# Patient Record
Sex: Female | Born: 2000 | Race: White | Hispanic: No | Marital: Single | Smoking: Current every day smoker
Health system: Southern US, Community
[De-identification: ages and names within clinical notes are randomized; demographics above are authoritative.]

---
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
## 2020-08-28 ENCOUNTER — Ambulatory Visit (INDEPENDENT_AMBULATORY_CARE_PROVIDER_SITE_OTHER): Payer: Medicaid Other | Admitting: Family Medicine

## 2020-08-28 ENCOUNTER — Other Ambulatory Visit: Payer: Self-pay

## 2020-08-28 ENCOUNTER — Encounter: Payer: Self-pay | Admitting: Family Medicine

## 2020-08-28 DIAGNOSIS — F489 Nonpsychotic mental disorder, unspecified: Secondary | ICD-10-CM | POA: Diagnosis present

## 2020-08-28 NOTE — Patient Instructions (Signed)
It was a pleasure to see you today!  For your symptoms I think you would be best served by psychiatry and therapy. I will give you a list of resources below. I recommend setting up with a counselor at your school when you go back to college as well. In case of suicidal ideation, call the new national hotline at 26.  Be Well,  Nancy Morris   Therapy and Counseling Resources Most providers on this list will take Medicaid. Patients with commercial insurance or Medicare should contact their insurance company to get a list of in network providers.  [facility]:Psychiatry and Counseling [address] [place] [place] [place], [place] [address] [phone]  [facility]  [address] Suite [place], [place] [address]      [phone]  [facility] [address]  [place], [place] [address] [phone]  [facility] [address]  [place], [place] [address]       [phone]     [facility] (virtual only) [phone]  Jovita Kussmaul [facility] [address] [place], [place] [phone]  [facility]:  [address] [place] [phone]  [facility]:  [address] Hessie Diener [phone]  [facility] (under & uninsured) [address]   [place] [phone]    [email or web address]    [facility] [address]  [place]    [phone]  [facility] -[address]     Phone:  [phone]     [facility]-  [address]  [phone]   [facility] [address]      [place], [place] [phone] ext [phone]  [facility]: [address] [place], [place], [place]  [phone]   [facility] (Spanish therapist) [email or web address]  [address] [place]  [address]   [place] [address]    [phone]    [facility]   [address],  [place], [place]  [phone]   [facility]  [address] [place]  [phone]  [facility]  [address] [place], [place]        [phone]  Open Access/Walk In Clinic under & uninsured  [facility]  [address] [place], [place] [phone] Crisis [phone]  [facility] [place],  (Spanish)   [address], [place]: [phone]) 8:30 - 12; 1 - 2:30  [facility] [employer],  [address] [place], [place]    [phone]):8:30 - 12; 2 - 3PM  [facility] [employer],  [address]  [place]; [phone]):   Mon - Fri 8 AM - 5 PM  [facility] [address] [place]  MWF 12:30 to 3:00 or call to schedule an appointment  [phone]  Specific Provider options Psychology Today  [email or web address] click on find a therapist  enter your zip code left side and select or tailor a therapist for your specific need.   [facility] Provider Directory [email or web address]  (Medicaid)   Follow all drop down to find a provider  Social Support program [facility] [place] [phone] or PhotoSolver.pl [address], [place], [place] Recovery support and educational   24- Hour Availability:   [facility]  [address] [place], [place] [employer]  [phone] Crisis [phone]  [facility] [phone]  [facility] Crisis Service  [phone]   [place] [facility] Crisis Services  [phone] (after hours)  Therapeutic Alternative/Mobile Crisis   [phone]  [place] National Suicide Hotline  5745493334 Len Childs)  Call 911 or go to emergency room  [facility]  [phone]);  [place] and [employer]  [phone]); [place], [place], [place], [place], [place],  [place], [place]

## 2020-08-29 DIAGNOSIS — F489 Nonpsychotic mental disorder, unspecified: Secondary | ICD-10-CM | POA: Insufficient documentation
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
## 2020-08-30 ENCOUNTER — Telehealth: Payer: Self-pay | Admitting: Psychology

## 2020-08-30 NOTE — Telephone Encounter (Signed)
Called and scheduled BH appt for 7/18 at 830AM

## 2020-09-02 ENCOUNTER — Other Ambulatory Visit: Payer: Self-pay

## 2020-09-02 ENCOUNTER — Ambulatory Visit (INDEPENDENT_AMBULATORY_CARE_PROVIDER_SITE_OTHER): Payer: Medicaid Other | Admitting: Psychology

## 2020-09-02 DIAGNOSIS — F32A Depression, unspecified: Secondary | ICD-10-CM | POA: Diagnosis not present
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
## 2020-09-09 ENCOUNTER — Ambulatory Visit: Payer: Medicaid Other | Admitting: Psychology

## 2020-10-25 ENCOUNTER — Telehealth: Payer: Self-pay | Admitting: *Deleted

## 2020-10-25 NOTE — Telephone Encounter (Signed)
Patient called and said she needed to schedule her follow up appt.  Will forward to Dr. Shawnee Knapp.  Marcellina Jonsson,CMA

## 2020-10-28 NOTE — Telephone Encounter (Signed)
Attempted to call twice this morning. Rang once and was not able to connect. Will try later this week. Pt does not have mychart set up.

## 2020-11-01 ENCOUNTER — Telehealth: Payer: Self-pay | Admitting: Psychology

## 2020-11-01 NOTE — Telephone Encounter (Signed)
Made several attempt to contact patient this week. Unable to leave VM

## 2021-08-27 NOTE — Patient Instructions (Addendum)
It was nice seeing you today!  Therapy resources below.  Make sure to come back for your pap smear when you are ready.  I believe you have what's called a ganglion cyst on your wrist.  Return for physical in 1 year or sooner if needed.  Stay well, Littie Deeds, MD [facility] [phone]  --  Make sure to check out at the front desk before you leave today.  Please arrive at least 15 minutes prior to your scheduled appointments.  If you had blood work today, I will send you a MyChart message or a letter if results are normal. Otherwise, I will give you a call.  If you had a referral placed, they will call you to set up an appointment. Please give [place] a call if you don't hear back in the next 2 weeks.  If you need additional refills before your next appointment, please call your pharmacy first.   --  Therapy and Counseling Resources Most providers on this list will take Medicaid. Patients with commercial insurance or Medicare should contact their insurance company to get a list of in network providers.  [facility] (spanish speaking therapist available)(habla espanol)(take medicare and medicaid)  [address], [place], [place] [address], [email or web address] [phone]  [facility]:Psychiatry and Counseling [address] [facility] [address] [place], [place] [address] [phone]  [facility]   [address] Suite [place], [place] [address]      [phone]  [facility] (spanish available) [address]  [place], [place] [address] [phone]  [facility] (take [facility] and medicare) [address], [address]  [place], [place] [address]       [phone]     [facility] (virtual only) [phone]  [facility] [address] [place], [place] [phone]  [facility]:  [address] [place] [phone]  [facility]:   [address] [place] [place]iener [phone]  [facility] (under & uninsured) [address]   [place] [phone]    [email or web address]    [facility] [address]  [place]    [phone]  [facility] -[address]     Phone:  [phone]     [facility]-  [address]  [phone]   [facility] [address]      [place], [place] [phone]  [facility]: [address] [place], [place], [place]  [phone]   [facility] (Spanish therapist) [email or web address]  [address] [place]  [address]   [place] [address]    [phone]    [facility]   [address],  [place], [place]  [phone]   [facility]  [address] [place]  [phone]  [facility]  [address] [place], [place]        [phone]  Open Access/Walk In Clinic under & uninsured  [facility]  [address] [place], [place] [phone] Crisis [phone]  [facility] of the [address],  (Spanish)   [address], [place]: [phone]) 8:30 - 12; 1 - 2:30  [facility] [employer],  [address] [place], [place]    [phone]):8:30 - 12; 2 - 3PM  [facility] [employer],  [address],  [place]; [phone]):   Mon - Fri 8 AM - 5 PM  [facility] [address] [place]  MWF 12:30 to 3:00 or call to schedule an appointment  [phone]  Specific Provider options Psychology Today  [email or web address]/us click on find a therapist  enter your zip code left side and select or tailor a therapist for your specific need.   [facility] Provider Directory [email or web address]  (Medicaid)   Follow all drop down to find a provider  Social  Support program Mental Health [place] [phone] or PhotoSolver.pl [address], [place], [place] Recovery support and educational   24- Hour Availability:   [facility]  [address] [place], [place] [phone] Crisis [phone]  [facility] of the [facility] [phone]  [place] [facility]  [phone]   [facility]  [phone] (after hours)  Therapeutic Alternative/Mobile Crisis   [phone]  [place] National Suicide Hotline  [phone] Len Childs)  Call 911 or go to emergency room  [facility]  [phone]);  [place] and [employer]  [phone]); [place], [place], [place], [place], [place], [place], [place]
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
## 2021-08-29 ENCOUNTER — Ambulatory Visit (INDEPENDENT_AMBULATORY_CARE_PROVIDER_SITE_OTHER): Payer: Self-pay | Admitting: Family Medicine

## 2021-08-29 ENCOUNTER — Encounter: Payer: Medicaid Other | Admitting: Student

## 2021-08-29 ENCOUNTER — Other Ambulatory Visit (HOSPITAL_COMMUNITY)
Admission: RE | Admit: 2021-08-29 | Discharge: 2021-08-29 | Disposition: A | Discharge status: Home / Self Care | Payer: Medicaid Other | Source: Ambulatory Visit | Attending: Family Medicine | Admitting: Family Medicine

## 2021-08-29 ENCOUNTER — Encounter: Payer: Self-pay | Admitting: Family Medicine

## 2021-08-29 VITALS — BP 100/62 | HR 75 | Ht 66.0 in | Wt 119.2 lb

## 2021-08-29 DIAGNOSIS — Z0000 Encounter for general adult medical examination without abnormal findings: Secondary | ICD-10-CM

## 2021-08-29 DIAGNOSIS — Z114 Encounter for screening for human immunodeficiency virus [HIV]: Secondary | ICD-10-CM

## 2021-08-29 DIAGNOSIS — Z1159 Encounter for screening for other viral diseases: Secondary | ICD-10-CM

## 2021-08-29 DIAGNOSIS — Z113 Encounter for screening for infections with a predominantly sexual mode of transmission: Secondary | ICD-10-CM | POA: Insufficient documentation

## 2021-08-29 DIAGNOSIS — M67432 Ganglion, left wrist: Secondary | ICD-10-CM

## 2021-08-29 DIAGNOSIS — F32A Depression, unspecified: Secondary | ICD-10-CM

## 2021-08-29 NOTE — Assessment & Plan Note (Signed)
Minimally bothersome.  Could consider ultrasound and referral to general surgery if patient desires to have this removed in the future.

## 2021-08-29 NOTE — Assessment & Plan Note (Signed)
PHQ-9 score 14.  No current SI.  Therapy resources provided.  Discussed can start antidepressant if patient desires in the future.

## 2021-08-29 NOTE — Assessment & Plan Note (Signed)
>>  ASSESSMENT AND PLAN FOR DEPRESSION WRITTEN ON 08/29/2021  5:29 PM BY AUSTIN ADE, MD  PHQ-9 score 14.  No current SI.  Therapy resources provided.  Discussed can start antidepressant if patient desires in the future.

## 2021-08-30 LAB — CBC
Hematocrit: 42 % (ref 34.0–46.6)
Hemoglobin: 14 g/dL (ref 11.1–15.9)
MCH: 30.3 pg (ref 26.6–33.0)
MCHC: 33.3 g/dL (ref 31.5–35.7)
MCV: 91 fL (ref 79–97)
Platelets: 273 10*3/uL (ref 150–450)
RBC: 4.62 x10E6/uL (ref 3.77–5.28)
RDW: 12 % (ref 11.7–15.4)
WBC: 5.4 10*3/uL (ref 3.4–10.8)

## 2021-08-30 LAB — LIPID PANEL
Chol/HDL Ratio: 2.5 ratio (ref 0.0–4.4)
Cholesterol, Total: 140 mg/dL (ref 100–199)
HDL: 57 mg/dL (ref >39)
LDL Chol Calc (NIH): 72 mg/dL (ref 0–99)
Triglycerides: 48 mg/dL (ref 0–149)
VLDL Cholesterol Cal: 11 mg/dL (ref 5–40)

## 2021-08-30 LAB — HIV ANTIBODY (ROUTINE TESTING W REFLEX): HIV Screen 4th Generation wRfx: NONREACTIVE

## 2021-08-30 LAB — RPR: RPR Ser Ql: NONREACTIVE

## 2021-08-30 LAB — HCV AB W REFLEX TO QUANT PCR: HCV Ab: NONREACTIVE

## 2021-08-30 LAB — HCV INTERPRETATION

## 2021-09-03 LAB — URINE CYTOLOGY ANCILLARY ONLY
Chlamydia: NEGATIVE
Comment: NEGATIVE
Comment: NEGATIVE
Comment: NORMAL
Neisseria Gonorrhea: NEGATIVE
Trichomonas: NEGATIVE

## 2021-12-25 IMAGING — DX DG KNEE COMPLETE 4+V*L*
1 series · 4 of 4 positions shown · non-contrast
Comparison: None.

CLINICAL DATA: Pain

EXAM:
LEFT KNEE - COMPLETE 4+ VIEW

[Series 1: knee · 0.14mm/px · 4 of 4 slices shown]
[im 1/4]
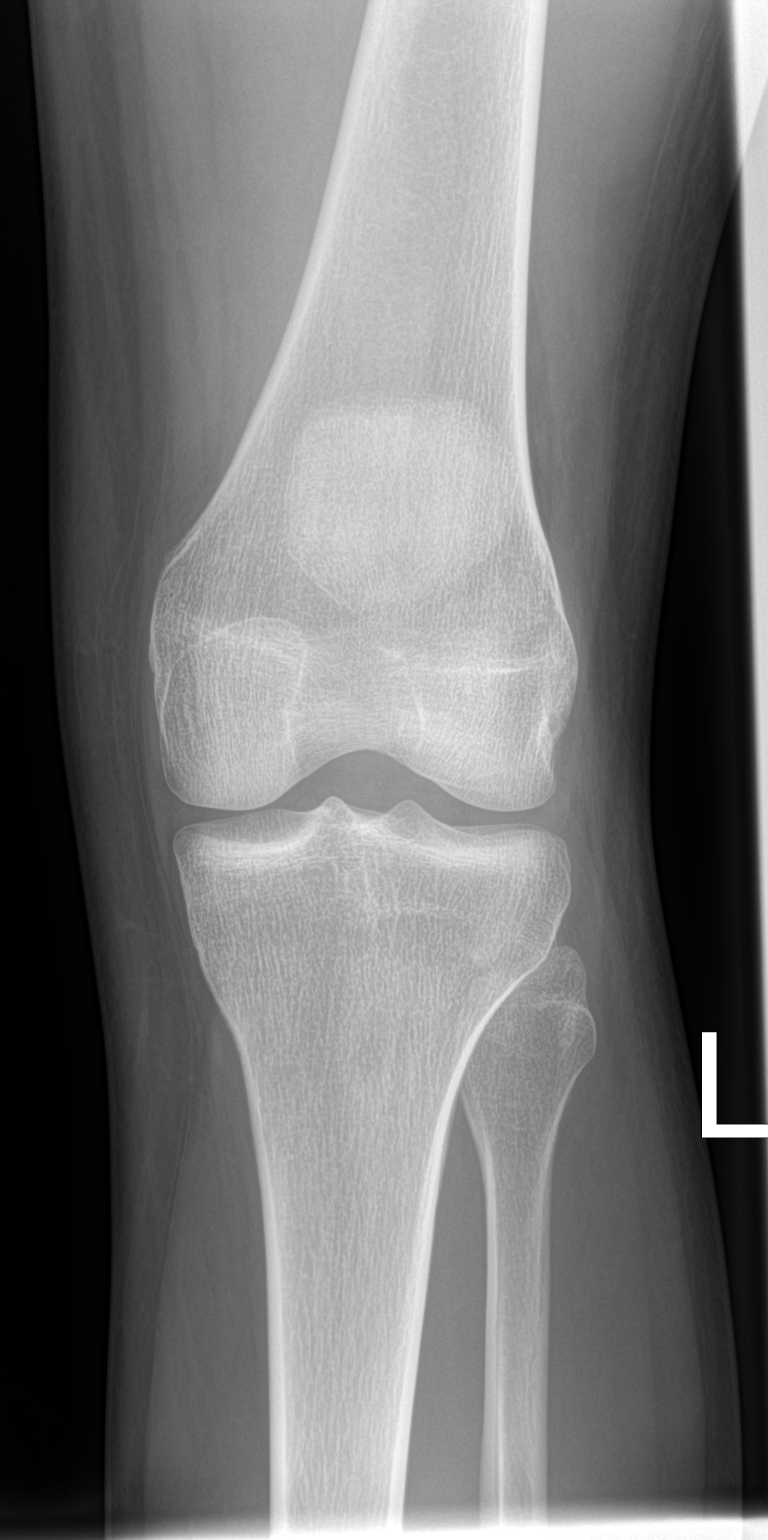
[im 2/4]
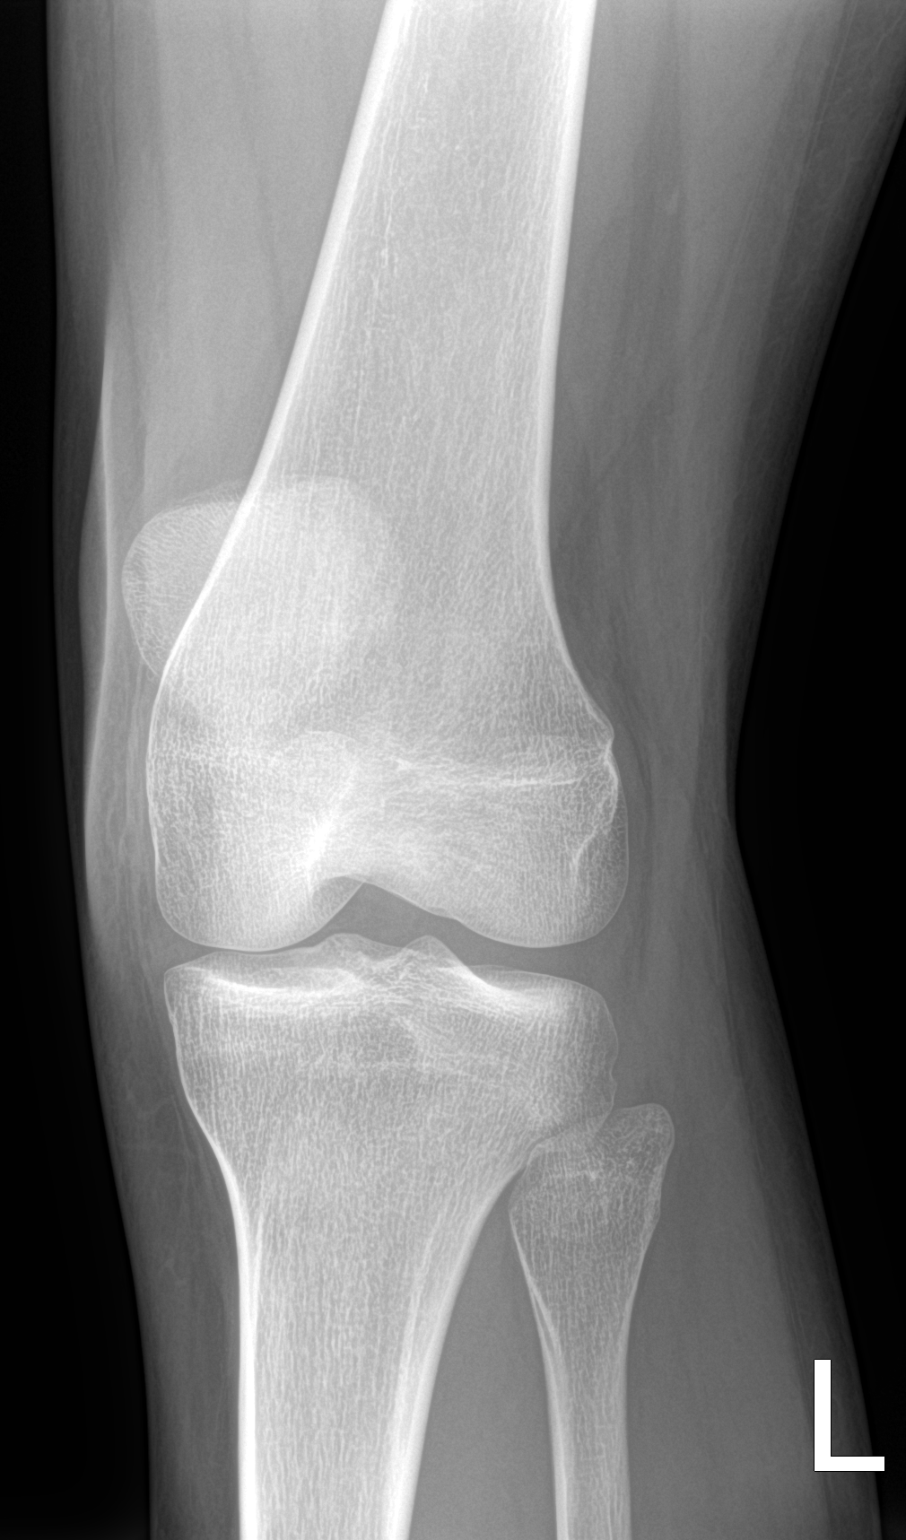
[im 3/4]
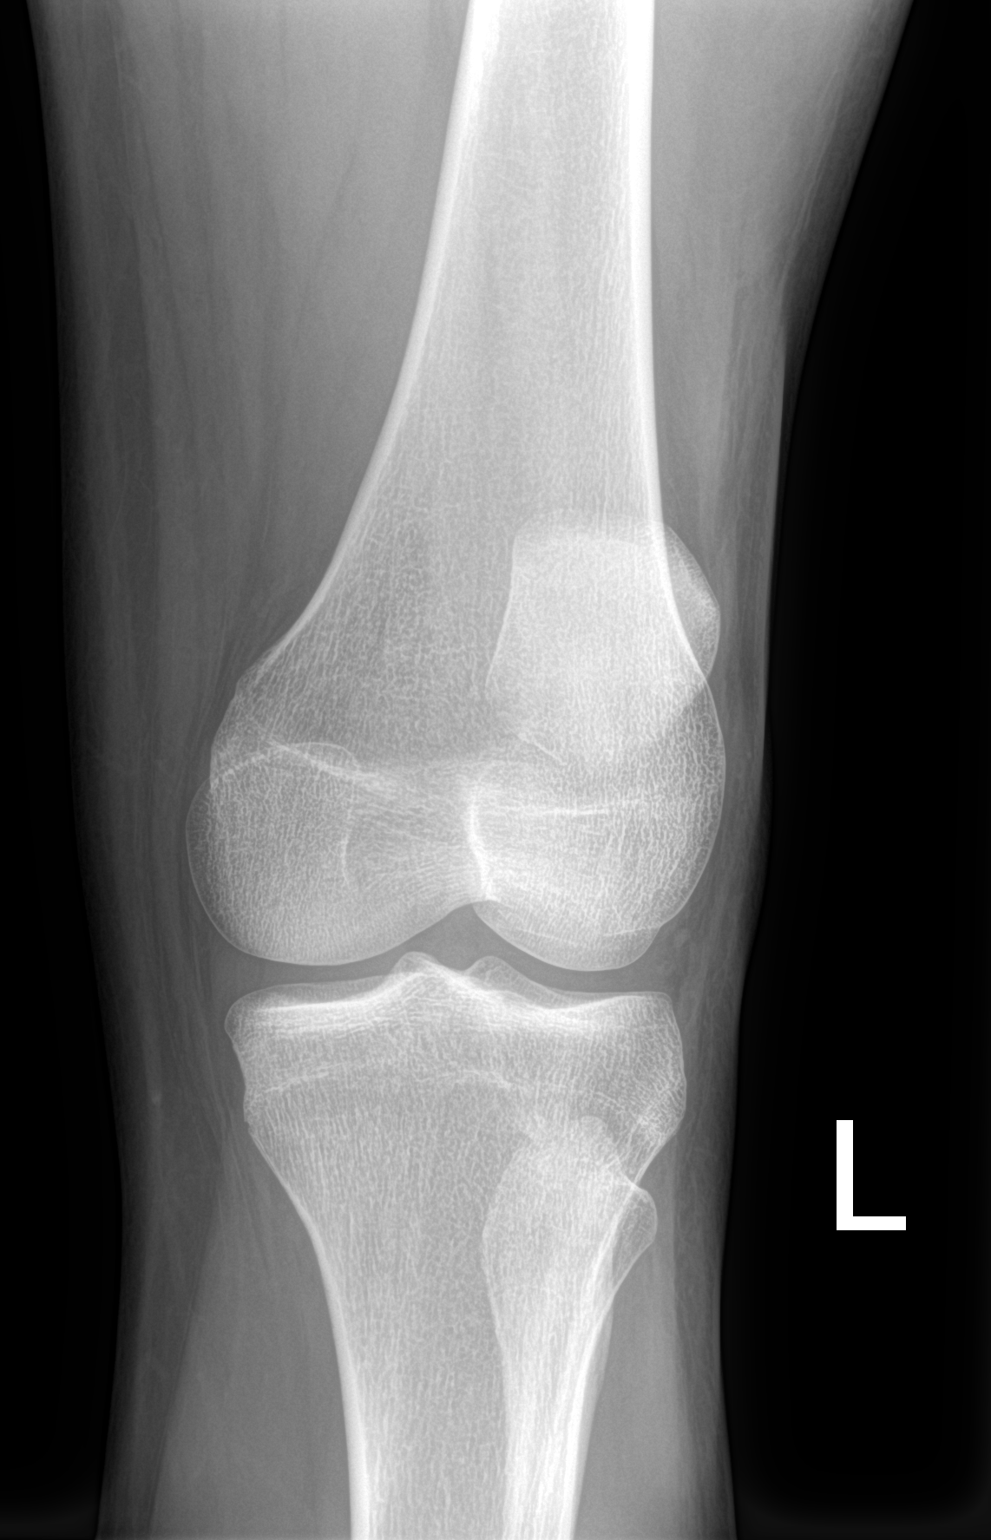
[im 4/4]
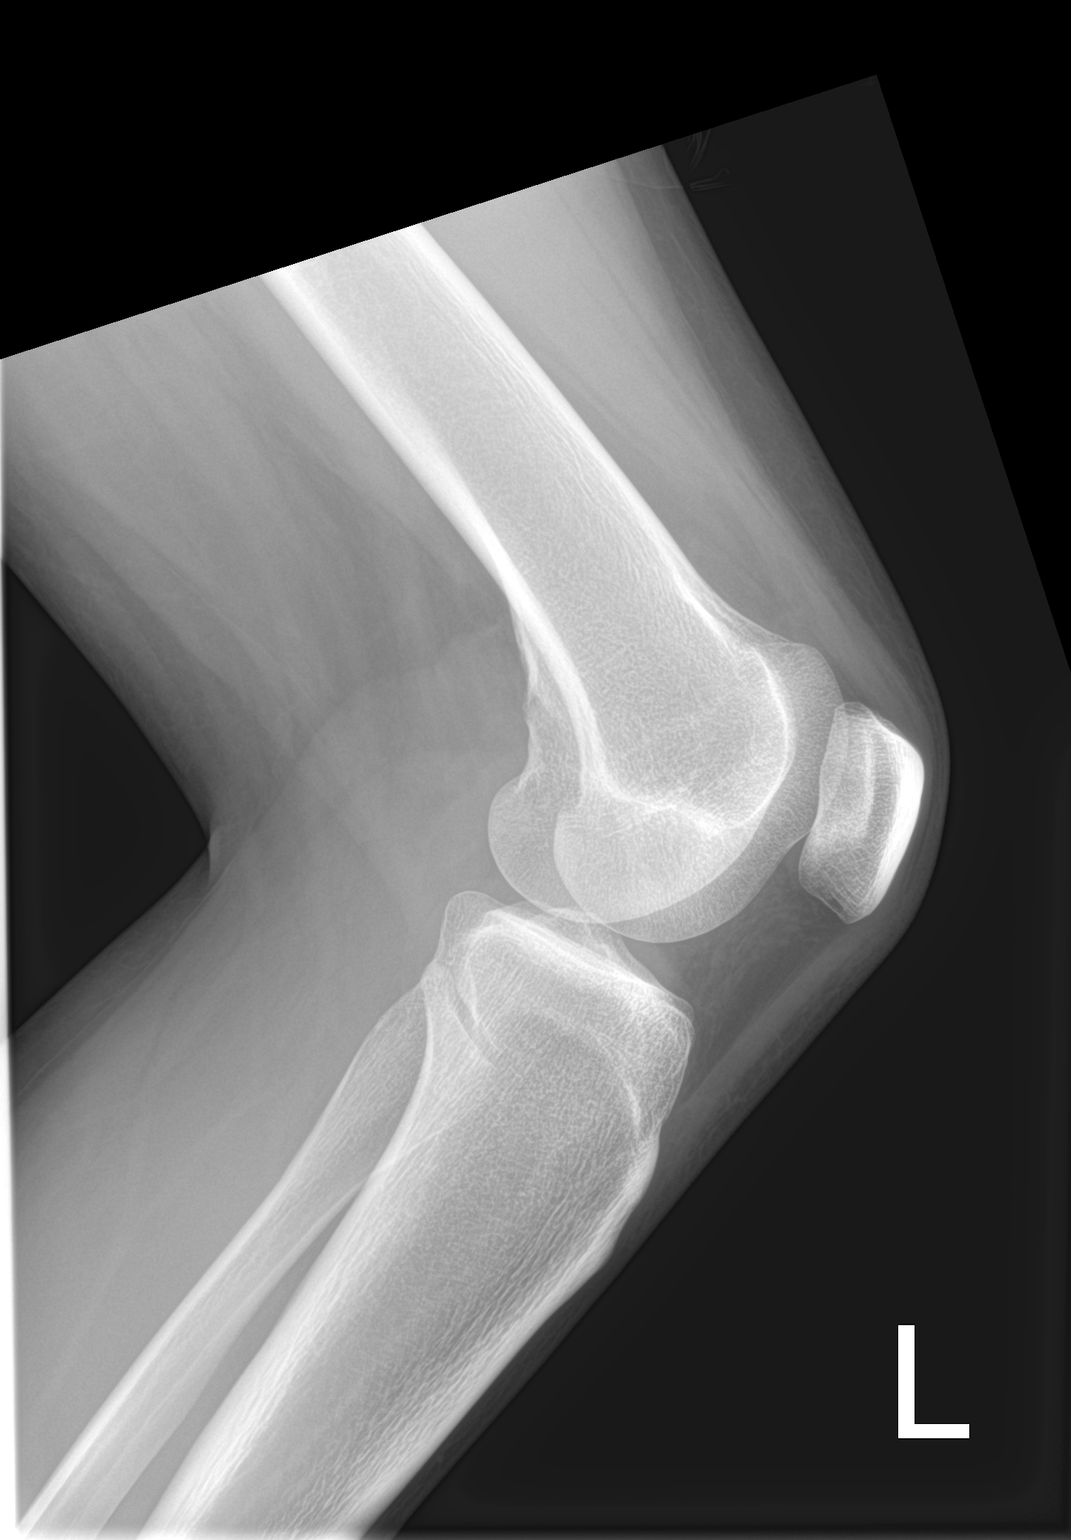

[4 of 4 positions shown; findings below may reference images not displayed]

FINDINGS: No evidence of fracture, dislocation, or joint effusion. No evidence
of arthropathy or other focal bone abnormality. Soft tissues are
unremarkable.
IMPRESSION: Negative.

## 2022-07-21 ENCOUNTER — Ambulatory Visit: Payer: Medicaid Other

## 2022-07-21 NOTE — Progress Notes (Deleted)
    SUBJECTIVE:   CHIEF COMPLAINT / HPI:   Possible strep throat  PERTINENT  PMH / PSH: ***  OBJECTIVE:   There were no vitals taken for this visit.  ***  ASSESSMENT/PLAN:   No problem-specific Assessment & Plan notes found for this encounter.     Nancy Mccoy, MD [facility]

## 2022-11-17 ENCOUNTER — Other Ambulatory Visit: Payer: Self-pay

## 2022-11-17 ENCOUNTER — Ambulatory Visit: Payer: Medicaid Other | Admitting: Student

## 2022-11-17 ENCOUNTER — Encounter: Payer: Self-pay | Admitting: Student

## 2022-11-17 VITALS — BP 112/74 | HR 81 | Ht 66.0 in | Wt 118.2 lb

## 2022-11-17 DIAGNOSIS — Z23 Encounter for immunization: Secondary | ICD-10-CM

## 2022-11-17 DIAGNOSIS — Z0000 Encounter for general adult medical examination without abnormal findings: Secondary | ICD-10-CM

## 2022-11-17 NOTE — Patient Instructions (Addendum)
It was great to see you! Thank you for allowing me to participate in your care!   I recommend that you always bring your medications to each appointment as this makes it easy to ensure we are on the correct medications and helps [place] not miss when refills are needed.  Our plans for today:  - Today you received your Tetanus shot - Follow-up for your Pap smear, the appointment is listed below  Take care and seek immediate care sooner if you develop any concerns. Please remember to show up 15 minutes before your scheduled appointment time!  Tiffany Kocher, DO [facility]    Therapy and Counseling Resources Most providers on this list will take Medicaid. Patients with commercial insurance or Medicare should contact their insurance company to get a list of in network providers.  [facility] (takes children) Location 1: [address] [place], [place] [address] Location 2: [address] [place], [place] [address] [phone]   [facility] (spanish speaking therapist available)(habla espanol)(take medicare and medicaid)  [address], [place], [place] [address], [email or web address] [email or web address] [phone]  [facility]:Psychiatry and Counseling [address] [facility] [place], [place] [address] [phone]  [facility]   [address], Suite [place], [place] [address]      [phone]  [facility] (spanish available) [address]  [place], [place] [address] [phone]  [facility] (take [facility] and medicare) [address]  [place], [place] [address]       [phone]     [facility] (virtual only) [phone]  [facility] [address], [place], [place] [phone]  [facility]:  [address] [place] [phone]  [facility]:  [address] [place]ien[place] [phone]  [facility] (under &  uninsured) [address]   [place] [phone]    [email or web address]     Behavioral Health [phone]  [place]    [phone]  [facility] -[address]     Phone:  [phone]     [facility]-  [address]  [phone]   [facility] [address]      [place], [place] [phone]  [facility]: [address] [place], [place], [place]  [phone]   [facility] (Spanish therapist) [email or web address]  [address] [place]  [address]   [place] [address]    [phone]    [facility]   [address],  [place], [place]  [phone]   [facility]  [address] [place]  [phone]  [facility]  [address] [place], [place]        [phone]  Open Access/Walk In Clinic under & uninsured  [facility]  [address] [place], [place] [phone] Crisis [phone]  [facility] of the [place],  (Spanish)   [address] [place], [place]: [phone]) 8:30 - 12; 1 - 2:30  [facility] the [employer],  [address] [place], [place]    ([phone]):8:30 - 12; 2 - 3PM  [facility] [employer],  [address]  [place]; [phone]):   Mon - Fri 8 AM - 5 PM  [facility] [address] [place]  MWF 12:30 to 3:00 or call  to schedule an appointment  [phone]  Specific Provider options Psychology Today  https://www.psychologytoday.com/us click on find a therapist  enter your zip code left side and select or tailor a therapist for your specific need.   [facility] Provider Directory [email or web address]  (Medicaid)   Follow all drop down to find a provider  Social Support program Mental Health [place] [phone] or PhotoSolver.pl [address], [place], [place] Recovery support and educational   24- Hour Availability:   [facility]  [address] [place], [place] [phone] Crisis [phone]  [facility] of the [facility] [phone]  [place] [facility]  [phone]   [facility]  [phone] (after hours)  Therapeutic Alternative/Mobile Crisis   [phone]  [place] National Suicide Hotline  [phone] Len Childs)  Call 911 or go to emergency room  [facility]  [phone]);  [place] and [employer]  [phone]); [place], [place], [place], [place], [place], [place], [place]
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
## 2022-11-26 NOTE — Progress Notes (Signed)
    SUBJECTIVE:   CHIEF COMPLAINT / HPI: Obtain pap smear  Pap Smear: Patient is a 22 y.o. female presenting for pap smear.  She states she does not have any discharge, odor or vaginal symptoms.  She is not  interested in screening for sexually transmitted infections today. She is on contraception none, same sex partners per chart review  PERTINENT  PMH / PSH: None relevant  OBJECTIVE:   BP 90/68   Pulse 60   Ht 5\' 6"  (1.676 m)   Wt 123 lb 6.4 oz (56 kg)   LMP 11/14/2022   SpO2 100%   BMI 19.92 kg/m    General: NAD, pleasant, able to participate in exam Respiratory: Normal effort, no obvious respiratory distress Pelvic: VULVA: normal appearing vulva with no masses, tenderness or lesions, VAGINA: Normal appearing vagina with normal color, no lesions, with copious and yellow discharge present, CERVIX: No lesions, copious and yellow discharge present. Pap smear performed with cytobrush and spatula  Chaperone Cleatrice Burke CMA present for pelvic exam  ASSESSMENT/PLAN:   Assessment:  22 y.o. female here for pap smear.  Physical exam significant for copious yellow discharge.  Patient is not interested in STI screening , however upon discussion she was amenable to G/C added to pap smear Plan: -F/u pap smear results -Gonnorhea, chlamydia  -RPR, HIV declined today -Follow-up as needed  Levin Erp, MD [facility]

## 2022-11-27 ENCOUNTER — Ambulatory Visit: Payer: Medicaid Other | Admitting: Student

## 2022-11-27 ENCOUNTER — Other Ambulatory Visit (HOSPITAL_COMMUNITY)
Admission: RE | Admit: 2022-11-27 | Discharge: 2022-11-27 | Disposition: A | Discharge status: Home / Self Care | Payer: Medicaid Other | Source: Ambulatory Visit | Attending: Family Medicine | Admitting: Family Medicine

## 2022-11-27 ENCOUNTER — Encounter: Payer: Self-pay | Admitting: Student

## 2022-11-27 VITALS — BP 90/68 | HR 60 | Ht 66.0 in | Wt 123.4 lb

## 2022-11-27 DIAGNOSIS — Z124 Encounter for screening for malignant neoplasm of cervix: Secondary | ICD-10-CM

## 2022-11-27 NOTE — Patient Instructions (Signed)
It was great to see you! Thank you for allowing me to participate in your care!   Our plans for today:  - We got your pap smear today, I will keep you updated on the results and when your next one is due!  Take care and seek immediate care sooner if you develop any concerns.  Levin Erp, MD

## 2022-12-02 ENCOUNTER — Telehealth: Payer: Self-pay | Admitting: Student

## 2022-12-02 LAB — CYTOLOGY - PAP
Chlamydia: NEGATIVE
Comment: NEGATIVE
Comment: NEGATIVE
Comment: NEGATIVE
Comment: NORMAL
Diagnosis: UNDETERMINED — AB
High risk HPV: POSITIVE — AB
Neisseria Gonorrhea: NEGATIVE
Trichomonas: NEGATIVE

## 2022-12-02 NOTE — Telephone Encounter (Signed)
Called patient and confirmed DOB. Pap with HR HPV and ASCUS. Repeat pap n 1 year. Has some yeast on sample but no symptoms so will not treat.

## 2023-10-08 ENCOUNTER — Ambulatory Visit (INDEPENDENT_AMBULATORY_CARE_PROVIDER_SITE_OTHER): Admitting: Family Medicine

## 2023-10-08 VITALS — BP 105/73 | HR 67 | Temp 98.2°F | Ht 66.0 in | Wt 111.1 lb

## 2023-10-08 DIAGNOSIS — L302 Cutaneous autosensitization: Secondary | ICD-10-CM

## 2023-10-08 DIAGNOSIS — B354 Tinea corporis: Secondary | ICD-10-CM | POA: Diagnosis present

## 2023-10-08 MED ORDER — TERBINAFINE HCL 250 MG PO TABS: 250.0000 mg | ORAL_TABLET | Freq: Every day | ORAL | 0 refills | Status: AC

## 2023-10-08 NOTE — Patient Instructions (Addendum)
 Take terbinafine  once daily for 2 weeks  You can also use over-the-counter Zyrtec or Allegra to help with your itching  Please let us  know if your rash worsens or spreads or you develop fevers, abdominal pain, nausea or vomiting  You will be due for Pap smear in October.  I recommend scheduling appointment around then

## 2023-10-08 NOTE — Progress Notes (Signed)
    SUBJECTIVE:   CHIEF COMPLAINT / HPI:   Concern for ringworm Started on R inner thigh 3wks ago Was using over-the-counter clotrimazole cream twice daily for the past 3 weeks.  This improved the lesion on her inner thigh a bit but it is still present.  It initially was very red and scaly and much more itchy than it is now. Soon after her right inner thigh lesion appeared, she developed a rash that spread across her lower abdomen and lower torso.  This rash is itchy.  It is not particularly scaly.  No bleeding, fluid collection, drainage She does report a viral URI about a week ago at which time she may have had a fever but recently has been feeling well aside from the itching   PERTINENT  PMH / PSH: MDD, GAD  OBJECTIVE:   BP 105/73   Pulse 67   Temp 98.2 F (36.8 C) (Oral)   Ht 5' 6 (1.676 m)   Wt 111 lb 2 oz (50.4 kg)   LMP 09/30/2023   SpO2 100%   BMI 17.94 kg/m   General: NAD, pleasant, able to participate in exam Respiratory: No respiratory distress Skin: Circular raised rash along inner upper right thigh.  Some overlying scaling.  On her abdomen, has several scattered slightly raised and erythematous blanching lesions without significant excoriation or scaling.  See images below Psych: Normal affect and mood       ASSESSMENT/PLAN:   Assessment & Plan Tinea corporis On inner right thigh Refractory to OTC clotrimazole cream twice daily x 3 weeks Treat with terbinafine  250 mg daily x 2 weeks Discussed return precautions Id reaction In the setting of dermatophyte infection Treating with terbinafine  as above Also can use Zyrtec/Allegra OTC to help with itching   Payton Coward, MD [facility]

## 2023-11-30 ENCOUNTER — Other Ambulatory Visit (HOSPITAL_COMMUNITY): Admission: RE | Admit: 2023-11-30 | Discharge: 2023-11-30 | Disposition: A | Source: Ambulatory Visit

## 2023-11-30 ENCOUNTER — Ambulatory Visit (INDEPENDENT_AMBULATORY_CARE_PROVIDER_SITE_OTHER): Admitting: Family Medicine

## 2023-11-30 ENCOUNTER — Encounter: Payer: Self-pay | Admitting: Family Medicine

## 2023-11-30 VITALS — BP 101/74 | HR 71 | Ht 66.0 in | Wt 114.2 lb

## 2023-11-30 DIAGNOSIS — Z23 Encounter for immunization: Secondary | ICD-10-CM | POA: Diagnosis present

## 2023-11-30 DIAGNOSIS — L700 Acne vulgaris: Secondary | ICD-10-CM

## 2023-11-30 DIAGNOSIS — Z124 Encounter for screening for malignant neoplasm of cervix: Secondary | ICD-10-CM | POA: Diagnosis present

## 2023-11-30 DIAGNOSIS — Z113 Encounter for screening for infections with a predominantly sexual mode of transmission: Secondary | ICD-10-CM | POA: Diagnosis not present

## 2023-11-30 MED ORDER — DOXYCYCLINE HYCLATE 100 MG PO TABS: 100.0000 mg | ORAL_TABLET | Freq: Every day | ORAL | 0 refills | Status: AC

## 2023-11-30 MED ORDER — BENZOYL PEROXIDE WASH 5 % EX LIQD: Freq: Two times a day (BID) | CUTANEOUS | 12 refills | Status: AC

## 2023-11-30 NOTE — Progress Notes (Signed)
    SUBJECTIVE:   Chief compliant/HPI: annual examination  Nancy Morris is a 23 y.o. who presents today for an annual exam.   Review of systems form notable for acne.   Acne Has had cystic acne for a few years. Has tried benzoyl peroxide, salicylic acid, azelaic acid, differin  cream. Gets large, painful pustules mostly on cheeks. Interested in antibiotic or oral retinoid.   Pap and STI screening today - no new partners in the last year - no vaginal itching, discharge, dysuria - preferred gender of partner: female, no female partners currently - Contraception: none currently, family members have had bad reactions to OCPs.  Possibly interested in Nexplanon moving forward.  Updated history tabs and problem list.   OBJECTIVE:   BP 101/74   Pulse 71   Ht 5' 6 (1.676 m)   Wt 114 lb 4 oz (51.8 kg)   LMP 11/17/2023   SpO2 100%   BMI 18.44 kg/m   General: Awake and conversant, no acute distress Pulm: normal work of breathing on room air Neuro: No focal deficits Psych: Appropriate mood and affect GU (chaperone TL, CMA present): normal female external genitalia, no lesions or rashes. Normal vaginal rugae. Moderate thin, yellow-green discharge at cervical os.  ASSESSMENT/PLAN:   Assessment & Plan  1. Screening for cervical cancer (Primary) Had ASCUS on last Pap, repeated Pap today - Cytology - PAP  2. Routine screening for STI (sexually transmitted infection) Testing for chlamydia, gonorrhea on cervicovaginal swab.  Patient also requesting HIV and syphilis testing today. - HIV Antibody (routine testing w rflx) - RPR  3. Acne vulgaris Ongoing problem after trying multiple topical medications.  Will trial doxycycline and benzyl peroxide today.  Also discussed that OCP can help with acne, patient hesitant to try this due to fear of ADE's.  Can discuss further moving forward. - doxycycline (VIBRA-TABS) 100 MG tablet; Take 1 tablet (100 mg total) by mouth daily.  Dispense:  90 tablet; Refill: 0 - benzoyl peroxide 5 % external liquid; Apply topically 2 (two) times daily.  Dispense: 142 g; Refill: 12   Annual Examination  See AVS for age appropriate recommendations.   PHQ score 5, reviewed and discussed. Blood pressure reviewed and at goal .  Asked about intimate partner violence and patient reports no concerns.  The patient currently uses nothing for contraception. Folate recommended as appropriate, minimum of 400 mcg per day.  Advanced directives not completed   Considered the following items based upon USPSTF recommendations: HIV testing:ordered Hepatitis C: recently completed and result reviewed, normal  Hepatitis B:recently completed and result reviewed, normal  Syphilis if at high risk: ordered GC/CT 24 or  younger, ordered. Lipid panel (nonfasting or fasting) discussed based upon AHA recommendations and not indicated.  Consider repeat every 4-6 years.  Reviewed risk factors for latent tuberculosis and not indicated.  Discussed family history, BRCA testing not indicated.  Denies family history of breast cancer. Cervical cancer screening: due for Pap today, cytology + HPV ordered Immunizations meningococcal B MyChart Activation:Already signed up   Follow up in 2-3 months to check on acne    Nancy Raring, MD [facility]

## 2023-11-30 NOTE — Patient Instructions (Addendum)
 Thank you for coming in today! Here is a summary of what we discussed:  For more information on contraception/birth control options, please visit the CDC contraception website. Please always use condoms to prevent unwanted pregnancy and sexually transmitted infections. Contraception and Birth Control Methods  Contraception  CDC    --For your acne, we can start you on daily doxycycline 100mg . You should continue to use the benzoyl peroxide face wash as well. --Please make a follow up appointment in 2-3 months so we can see how things are going with your acne.  We are checking some labs today. If they are abnormal, I will call you. If they are normal, I will send you a MyChart message (if it is active) or a letter in the mail. If you do not hear about your labs in the next 2 weeks, please call the office.   Please call the clinic at [phone] if your symptoms worsen or you have any concerns.  Best, Dr Adele

## 2023-12-01 LAB — HIV ANTIBODY (ROUTINE TESTING W REFLEX): HIV Screen 4th Generation wRfx: NONREACTIVE

## 2023-12-01 LAB — RPR: RPR Ser Ql: NONREACTIVE

## 2023-12-03 LAB — CYTOLOGY - PAP
Adequacy: ABSENT
Chlamydia: NEGATIVE
Comment: NEGATIVE
Comment: NEGATIVE
Comment: NEGATIVE
Comment: NORMAL
Diagnosis: NEGATIVE
High risk HPV: POSITIVE — AB
Neisseria Gonorrhea: NEGATIVE
Trichomonas: NEGATIVE

## 2023-12-07 ENCOUNTER — Ambulatory Visit: Payer: Self-pay | Admitting: Family Medicine

## 2023-12-13 NOTE — Telephone Encounter (Signed)
 Notified of unread MyChart message regarding need for repeat pap in 1 year (Oct 2026). Left VM for pt. Please advise pt to check MyChart if she returns call.

## 2024-02-01 ENCOUNTER — Ambulatory Visit

## 2024-02-01 VITALS — BP 113/71 | HR 129 | Temp 101.4°F | Ht 66.0 in | Wt 109.8 lb

## 2024-02-01 DIAGNOSIS — J020 Streptococcal pharyngitis: Secondary | ICD-10-CM

## 2024-02-01 MED ORDER — AMOXICILLIN 500 MG PO TABS: 500.0000 mg | ORAL_TABLET | Freq: Two times a day (BID) | ORAL | 0 refills | Status: AC

## 2024-02-01 NOTE — Progress Notes (Unsigned)
° ° °  SUBJECTIVE:   CHIEF COMPLAINT / HPI: sore throat  Patient presents today with sick symptoms that began 4 days ago.  She complains of sore throat, pain with swallowing, hard, inflamed lymph nodes, congestion, fever, headache, and chills.  She has no known sick contacts.  Lymphnodes hard, inflamed, hurts to swallow, stuffy nose, fever, HA. Chills. 4 days ago. Aspirin, Alkaseltzer, tea water. No known sick contacts. Last time sick was June. Bad strep as a kid, tonsils removed. Diarrhea - thinks is from food.   PERTINENT  PMH / PSH: ***  OBJECTIVE:   BP 113/71   Pulse (!) 129   Temp (!) 101.4 F (38.6 C) (Oral)   Ht 5' 6 (1.676 m)   Wt 109 lb 12.8 oz (49.8 kg)   SpO2 95%   BMI 17.72 kg/m   ***  ASSESSMENT/PLAN:   Assessment & Plan      Nancy KANDICE Lee, DO [facility]

## 2024-02-01 NOTE — Patient Instructions (Signed)
 Based on your symptoms it seems most likely that you have strep throat. I have sent in Amoxicillin  500 mg to your pharmacy. You will take this twice a day for 10 days. The results of your swab will come back in 2-3 days. I will send you a MyChart message with the results.   I hope you feel better, and good luck on your test!

## 2024-02-04 ENCOUNTER — Ambulatory Visit: Payer: Self-pay

## 2024-02-04 LAB — CULTURE, GROUP A STREP
